# Patient Record
Sex: Male | Born: 1995 | Race: White | Hispanic: No | Marital: Single
Health system: Southern US, Community
[De-identification: ages and names within clinical notes are randomized; demographics above are authoritative.]

---
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
## 2019-11-27 ENCOUNTER — Other Ambulatory Visit: Payer: Self-pay

## 2019-11-27 DIAGNOSIS — Z20822 Contact with and (suspected) exposure to covid-19: Secondary | ICD-10-CM

## 2019-11-29 LAB — NOVEL CORONAVIRUS, NAA: SARS-CoV-2, NAA: NOT DETECTED

## 2019-11-29 LAB — SARS-COV-2, NAA 2 DAY TAT

## 2020-07-29 ENCOUNTER — Other Ambulatory Visit: Payer: Self-pay

## 2020-07-29 ENCOUNTER — Emergency Department (HOSPITAL_COMMUNITY)
Admission: EM | Admit: 2020-07-29 | Discharge: 2020-07-29 | Disposition: A | Discharge status: Home / Self Care | Payer: Managed Care, Other (non HMO) | Attending: Emergency Medicine | Admitting: Emergency Medicine

## 2020-07-29 ENCOUNTER — Encounter (HOSPITAL_COMMUNITY): Payer: Self-pay

## 2020-07-29 ENCOUNTER — Emergency Department (HOSPITAL_COMMUNITY): Payer: Managed Care, Other (non HMO)

## 2020-07-29 DIAGNOSIS — M25572 Pain in left ankle and joints of left foot: Secondary | ICD-10-CM

## 2020-07-29 DIAGNOSIS — X501XXA Overexertion from prolonged static or awkward postures, initial encounter: Secondary | ICD-10-CM | POA: Diagnosis not present

## 2020-07-29 DIAGNOSIS — R2242 Localized swelling, mass and lump, left lower limb: Secondary | ICD-10-CM | POA: Diagnosis not present

## 2020-07-29 DIAGNOSIS — Y92838 Other recreation area as the place of occurrence of the external cause: Secondary | ICD-10-CM | POA: Diagnosis not present

## 2020-07-29 NOTE — ED Notes (Signed)
Ankle was wrapped and patient was given ice.

## 2020-07-29 NOTE — ED Notes (Signed)
Left pedal pulse strong

## 2020-07-29 NOTE — Discharge Instructions (Signed)
Please take Ibuprofen (Advil, motrin) and Tylenol (acetaminophen) to relieve your pain.   ? ?You may take up to 600 MG (3 pills) of normal strength ibuprofen every 8 hours as needed.   ?You make take tylenol, up to 1,000 mg (two extra strength pills) every 8 hours as needed.  ? ?It is safe to take ibuprofen and tylenol at the same time as they work differently.  ? Do not take more than 3,000 mg tylenol in a 24 hour period (not more than one dose every 8 hours.  Please check all medication labels as many medications such as pain and cold medications may contain tylenol.  Do not drink alcohol while taking these medications.  Do not take other NSAID'S while taking ibuprofen (such as aleve or naproxen).  Please take ibuprofen with food to decrease stomach upset. ? ?

## 2020-07-29 NOTE — ED Provider Notes (Signed)
[facility]-EMERGENCY DEPT Provider Note   CSN: [id number] Arrival date & time: 07/29/20  2046     History Chief Complaint  Patient presents with  . Ankle Pain    Kenneth Beck is a 25 y.o. male with no pertinent past medical history who presents today for evaluation of left ankle pain and swelling.  Kenneth Beck states that Kenneth Beck was at a trampoline park with his kids earlier today and rolled his ankle.  Kenneth Beck felt a pop.  Kenneth Beck was initially able to bear weight however as the day progressed has been unable to bear weight.  Kenneth Beck arrives already with crutches.  Kenneth Beck states that Kenneth Beck bought a brace at the store prior to arrival but the pain continues.  Kenneth Beck took 800 mg of ibuprofen without relief in his pain.  His pain is isolated in the left ankle.  No pain in the left knee and Kenneth Beck denies any other injuries.  No wounds reported.  HPI     History reviewed. No pertinent past medical history.  There are no problems to display for this patient.   History reviewed. No pertinent surgical history.     History reviewed. No pertinent family history.     Home Medications Prior to Admission medications   Not on File    Allergies    Patient has no known allergies.  Review of Systems   Review of Systems  Constitutional: Negative for chills and fever.  Musculoskeletal: Positive for joint swelling.       Left ankle pain  Skin: Negative for color change, rash and wound.  Neurological: Negative for weakness and headaches.  All other systems reviewed and are negative.   Physical Exam Updated Vital Signs BP 127/79 (BP Location: Left Arm)   Pulse 81   Temp 99.1 F (37.3 C) (Oral)   Resp 20   Ht 6' (1.829 m)   Wt 92.5 kg   SpO2 98%   BMI 27.67 kg/m   Physical Exam Vitals and nursing note reviewed.  Constitutional:      General: Kenneth Beck is not in acute distress. HENT:     Head: Normocephalic and atraumatic.  Cardiovascular:     Rate and Rhythm: Normal rate.     Comments: 2+  left DP pulse.  Toes on left foot are warm, appear well-perfused with brisk capillary refill. Pulmonary:     Effort: Pulmonary effort is normal. No respiratory distress.  Musculoskeletal:     Cervical back: No rigidity.     Comments: Left lower extremity: There is obvious edema both medial and laterally around the left ankle.  There is no pain or tenderness to palpation in the left distal foot, or left proximal lower leg.  Full range of motion of the left knee.  Patient is able to move toes on his left foot.  Skin:    Comments: No wounds visualized over the left ankle.  Neurological:     Mental Status: Kenneth Beck is alert. Mental status is at baseline.     Comments: Awake and alert, answers all questions appropriately.  Speech is not slurred.    Psychiatric:        Mood and Affect: Mood normal.     ED Results / Procedures / Treatments   Labs (all labs ordered are listed, but only abnormal results are displayed) Labs Reviewed - No data to display  EKG None  Radiology DG Ankle Complete Left  Result Date: 07/29/2020 CLINICAL DATA:  Pain and edema. Twisting injury on  trampoline. Felt a pop. EXAM: LEFT ANKLE COMPLETE - 3+ VIEW COMPARISON:  None. FINDINGS: No fracture or dislocation. Normal hindfoot alignment. The ankle mortise is preserved. There may be a small joint effusion. Moderate circumferential soft tissue edema. IMPRESSION: Soft tissue edema and possible small joint effusion. No fracture or dislocation. Electronically Signed   By: Narda Rutherford M.D.   On: 07/29/2020 22:00    Procedures Procedures   Medications Ordered in ED Medications - No data to display  ED Course  I have reviewed the triage vital signs and the nursing notes.  Pertinent labs & imaging results that were available during my care of the patient were reviewed by me and considered in my medical decision making (see chart for details).    MDM Rules/Calculators/A&P                         Presents with left  ankle pain after rolling his ankle at a trampoline park consistent with a ankle sprain/strain.  The affected ankle is tender on the bilateral aspect.  X-rays were obtained with out fracture, does show soft tissue edema and a possible small joint effusion. The skin is intact to ankle/foot.  The foot is warm and well perfused with intact sensation.  Motor function is limited secondary to pain.  Patient given instructions for OTC pain medication, elevation, PRICE.  Kenneth Beck has crutches already and an ankle brace at home. .  Patient advised to follow up with PCP if symptoms persist for longer than one week.  Patient was given the option to ask questions, all of which were answered to the best of my ability.  Patient is agreeable for discharge.  Return precautions were discussed with patient who states their understanding.  At the time of discharge patient denied any unaddressed complaints or concerns.  Patient is agreeable for discharge home.  Note: Portions of this report may have been transcribed using voice recognition software. Every effort was made to ensure accuracy; however, inadvertent computerized transcription errors may be present Final Clinical Impression(s) / ED Diagnoses Final diagnoses:  Acute left ankle pain    Rx / DC Orders ED Discharge Orders    None       Norman Clay 07/30/20 Lindi Adie, MD 07/30/20 1500

## 2020-07-29 NOTE — ED Triage Notes (Signed)
Injury to left ankle around 1pm today at trampoline park. Pt heard a pop. He was able to bear weight on it since the injury and over the last few hours has not been able to bear weight on it. Pt arrived on crutches.

## 2020-07-29 NOTE — ED Notes (Signed)
Pt to ct via stretcher

## 2020-07-29 NOTE — ED Notes (Signed)
Pt unable to sign MSE waiver due to being in hallway D and unable to bear weight on left foot. Keypad will not reach to pt bed. Pt verbalizes understanding of MSE

## 2022-01-14 IMAGING — CR DG ANKLE COMPLETE 3+V*L*
3 series · 3 of 3 positions shown · non-contrast
Comparison: None.

CLINICAL DATA: Pain and edema. Twisting injury on trampoline. Felt
a pop.

EXAM:
LEFT ANKLE COMPLETE - 3+ VIEW

[x ankle ap left]
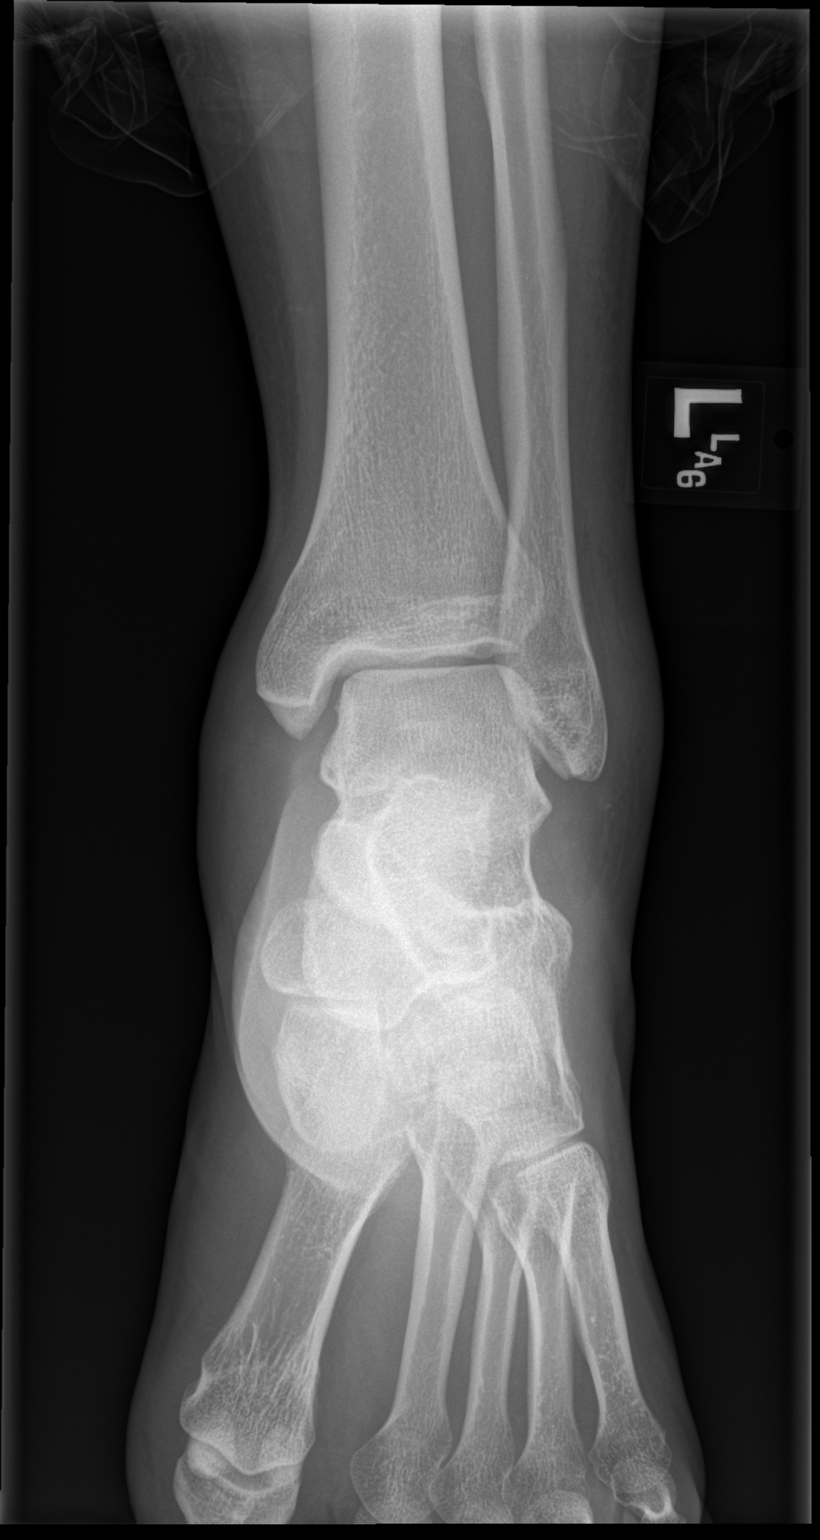

[x ankle obl left]
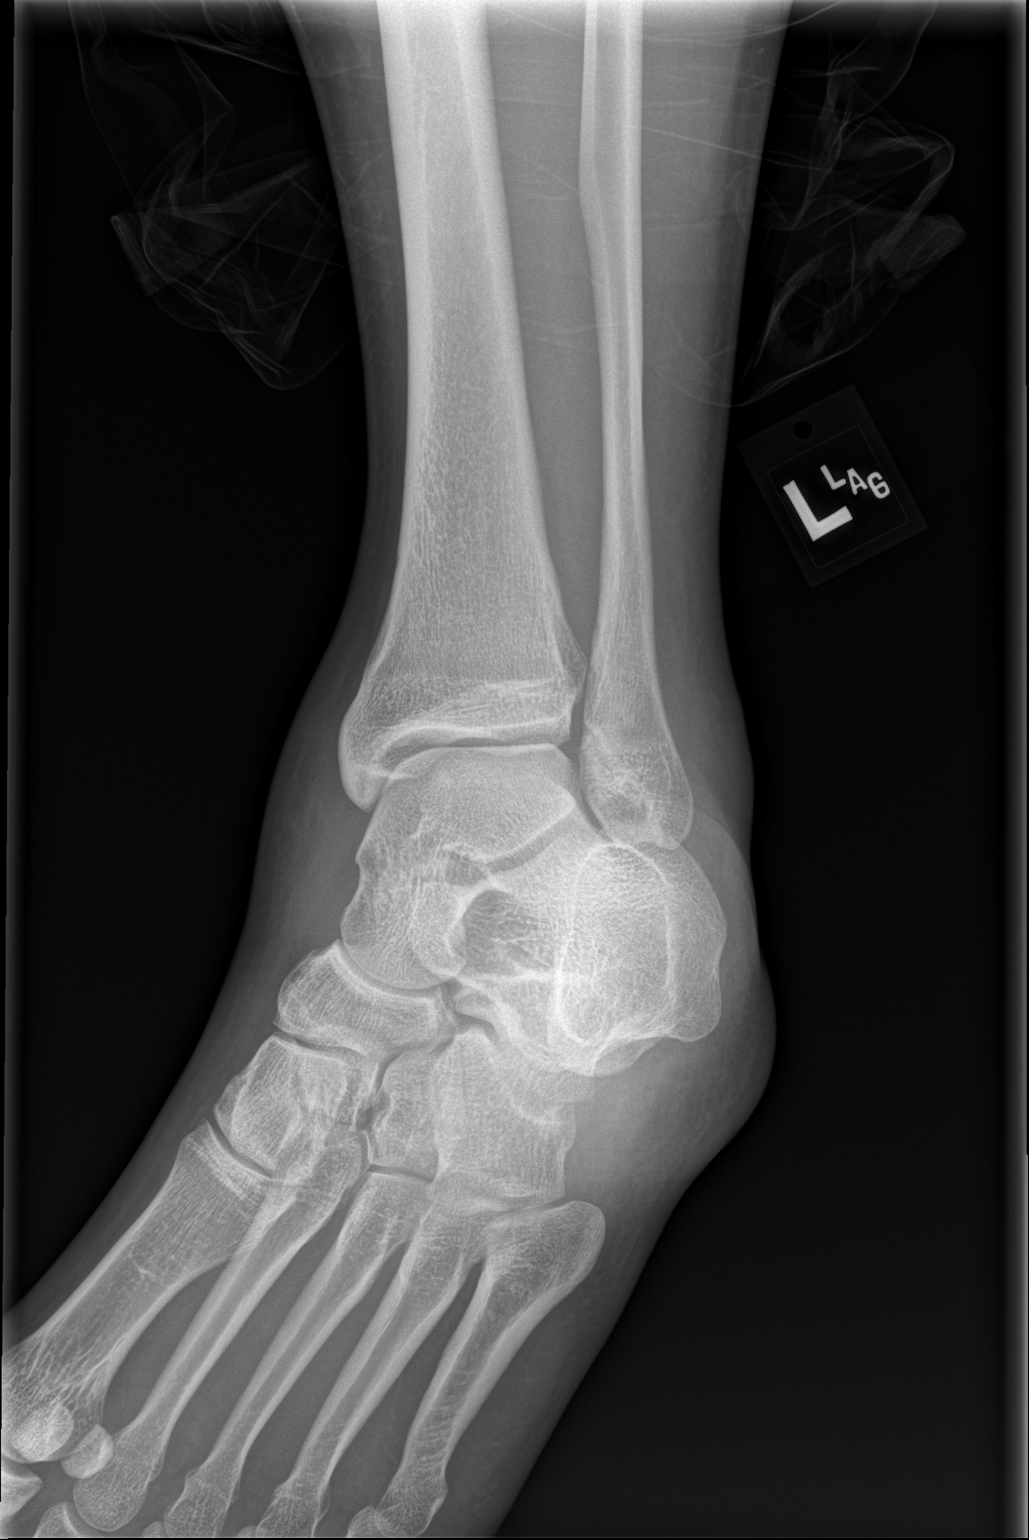

[x ankle lat left]
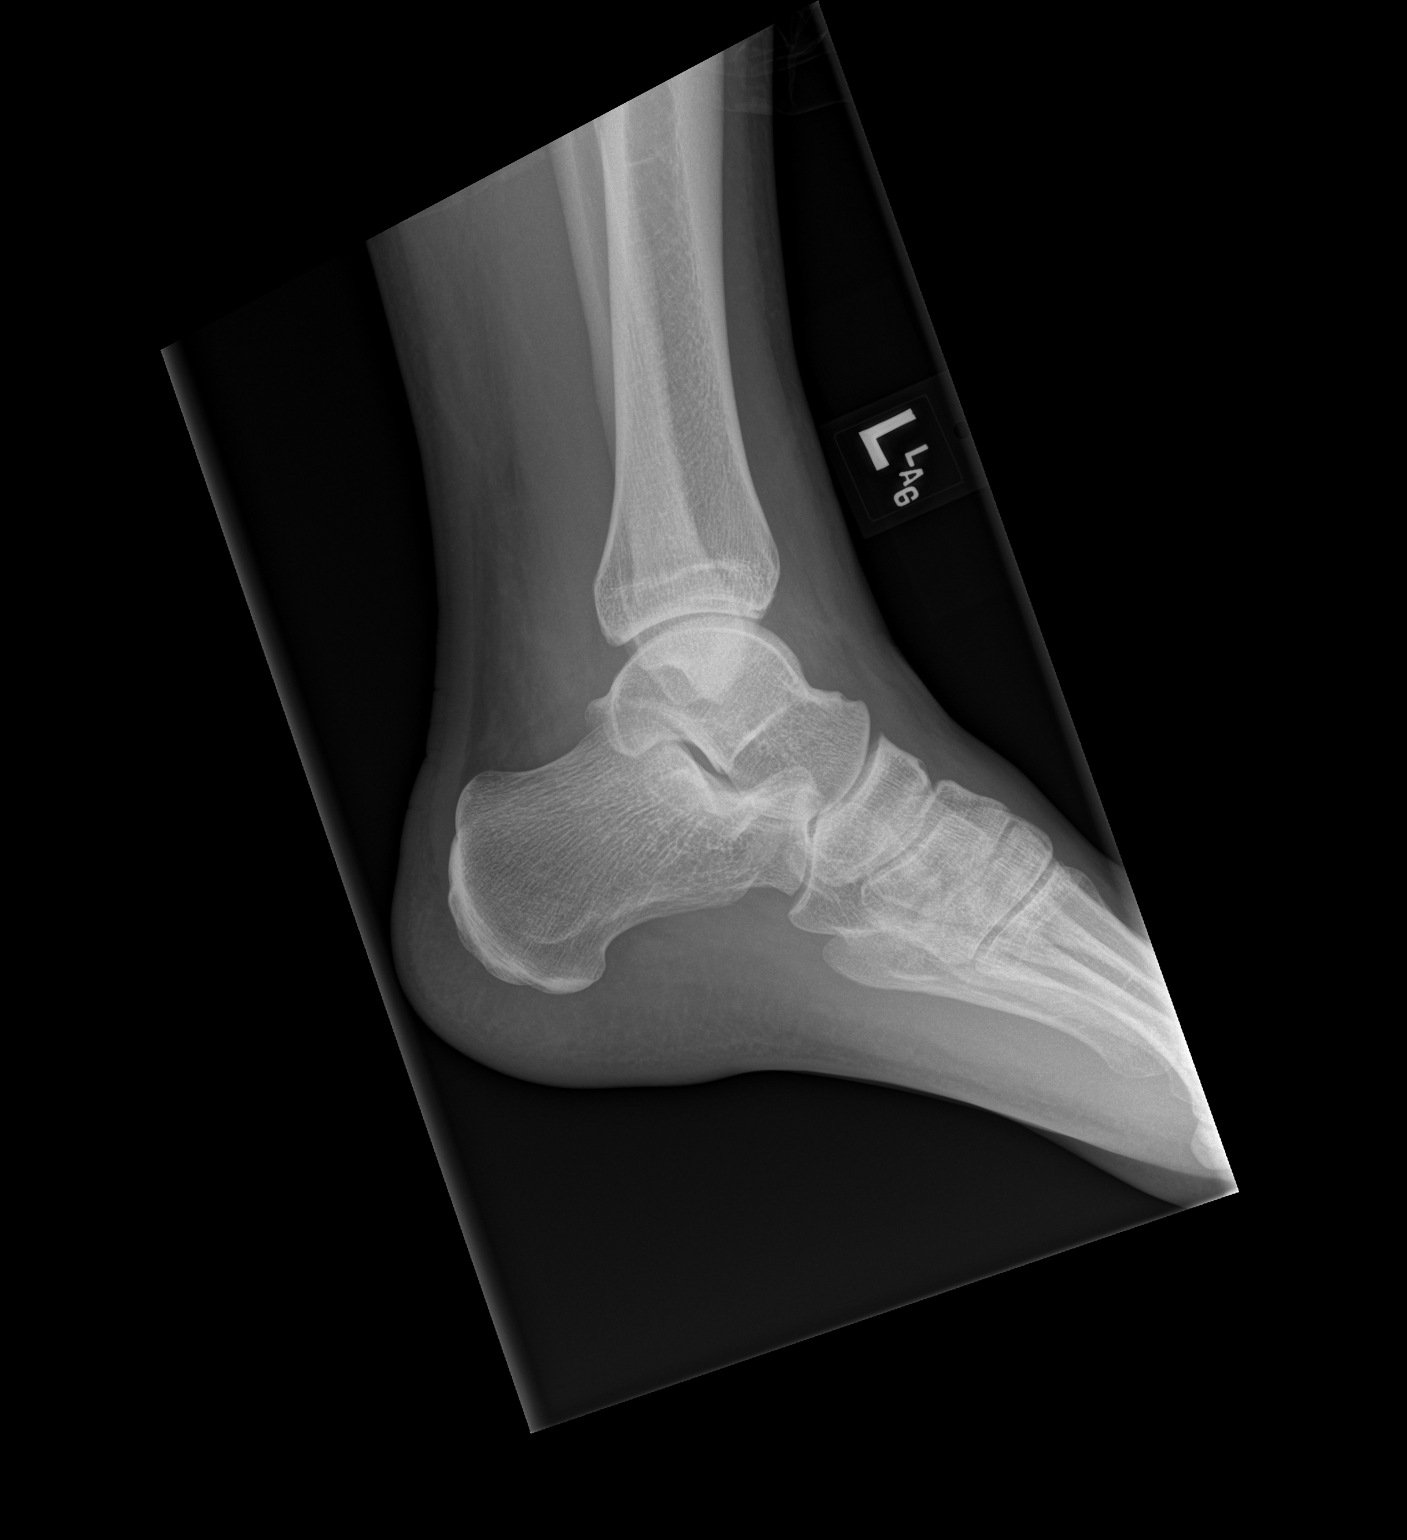

[3 of 3 positions shown; findings below may reference images not displayed]

FINDINGS: No fracture or dislocation. Normal hindfoot alignment. The ankle
mortise is preserved. There may be a small joint effusion. Moderate
circumferential soft tissue edema.
IMPRESSION: Soft tissue edema and possible small joint effusion. No fracture or
dislocation.
